# Patient Record
Sex: Female | Born: 1935 | Race: White | Hispanic: No | Marital: Married
Health system: Southern US, Community
[De-identification: ages and names within clinical notes are randomized; demographics above are authoritative.]

---

## 1998-08-26 ENCOUNTER — Encounter: Payer: Self-pay | Admitting: Thoracic Surgery

## 1998-08-26 ENCOUNTER — Ambulatory Visit (HOSPITAL_COMMUNITY): Admission: RE | Admit: 1998-08-26 | Discharge: 1998-08-30 | Payer: Self-pay | Admitting: Thoracic Surgery

## 1998-09-06 ENCOUNTER — Ambulatory Visit (HOSPITAL_COMMUNITY): Admission: RE | Admit: 1998-09-06 | Discharge: 1998-09-06 | Payer: Self-pay | Admitting: Thoracic Surgery

## 1998-11-22 ENCOUNTER — Ambulatory Visit (HOSPITAL_COMMUNITY): Admission: RE | Admit: 1998-11-22 | Discharge: 1998-11-22 | Payer: Self-pay | Admitting: Thoracic Surgery

## 1999-03-01 ENCOUNTER — Encounter: Payer: Self-pay | Admitting: Thoracic Surgery

## 1999-03-01 ENCOUNTER — Ambulatory Visit (HOSPITAL_COMMUNITY): Admission: RE | Admit: 1999-03-01 | Discharge: 1999-03-01 | Payer: Self-pay | Admitting: Thoracic Surgery

## 1999-05-22 ENCOUNTER — Inpatient Hospital Stay (HOSPITAL_COMMUNITY): Admission: AD | Admit: 1999-05-22 | Discharge: 1999-05-26 | Payer: Self-pay | Admitting: *Deleted

## 2002-09-10 ENCOUNTER — Ambulatory Visit (HOSPITAL_COMMUNITY): Admission: RE | Admit: 2002-09-10 | Discharge: 2002-09-10 | Payer: Self-pay | Admitting: Vascular Surgery

## 2002-09-10 ENCOUNTER — Encounter: Payer: Self-pay | Admitting: Vascular Surgery

## 2004-02-13 ENCOUNTER — Observation Stay (HOSPITAL_COMMUNITY): Admission: EM | Admit: 2004-02-13 | Discharge: 2004-02-13 | Payer: Self-pay | Admitting: Emergency Medicine

## 2004-02-22 ENCOUNTER — Ambulatory Visit (HOSPITAL_COMMUNITY): Admission: RE | Admit: 2004-02-22 | Discharge: 2004-02-22 | Payer: Self-pay | Admitting: Vascular Surgery

## 2004-07-18 ENCOUNTER — Ambulatory Visit (HOSPITAL_COMMUNITY): Admission: RE | Admit: 2004-07-18 | Discharge: 2004-07-18 | Payer: Self-pay | Admitting: Vascular Surgery

## 2004-08-02 ENCOUNTER — Inpatient Hospital Stay (HOSPITAL_COMMUNITY): Admission: AD | Admit: 2004-08-02 | Discharge: 2004-08-06 | Payer: Self-pay | Admitting: Nephrology

## 2004-08-29 IMAGING — CR DG CHEST 2V
2 series · 2 of 2 positions shown · non-contrast
Comparison: none

CLINICAL DATA: Renal failure.  Preoperative exam.
 TWO VIEWS OF THE CHEST ? 02/22/04 
 Comparison study from 02/13/04.

[view not recorded (1 of 2)]
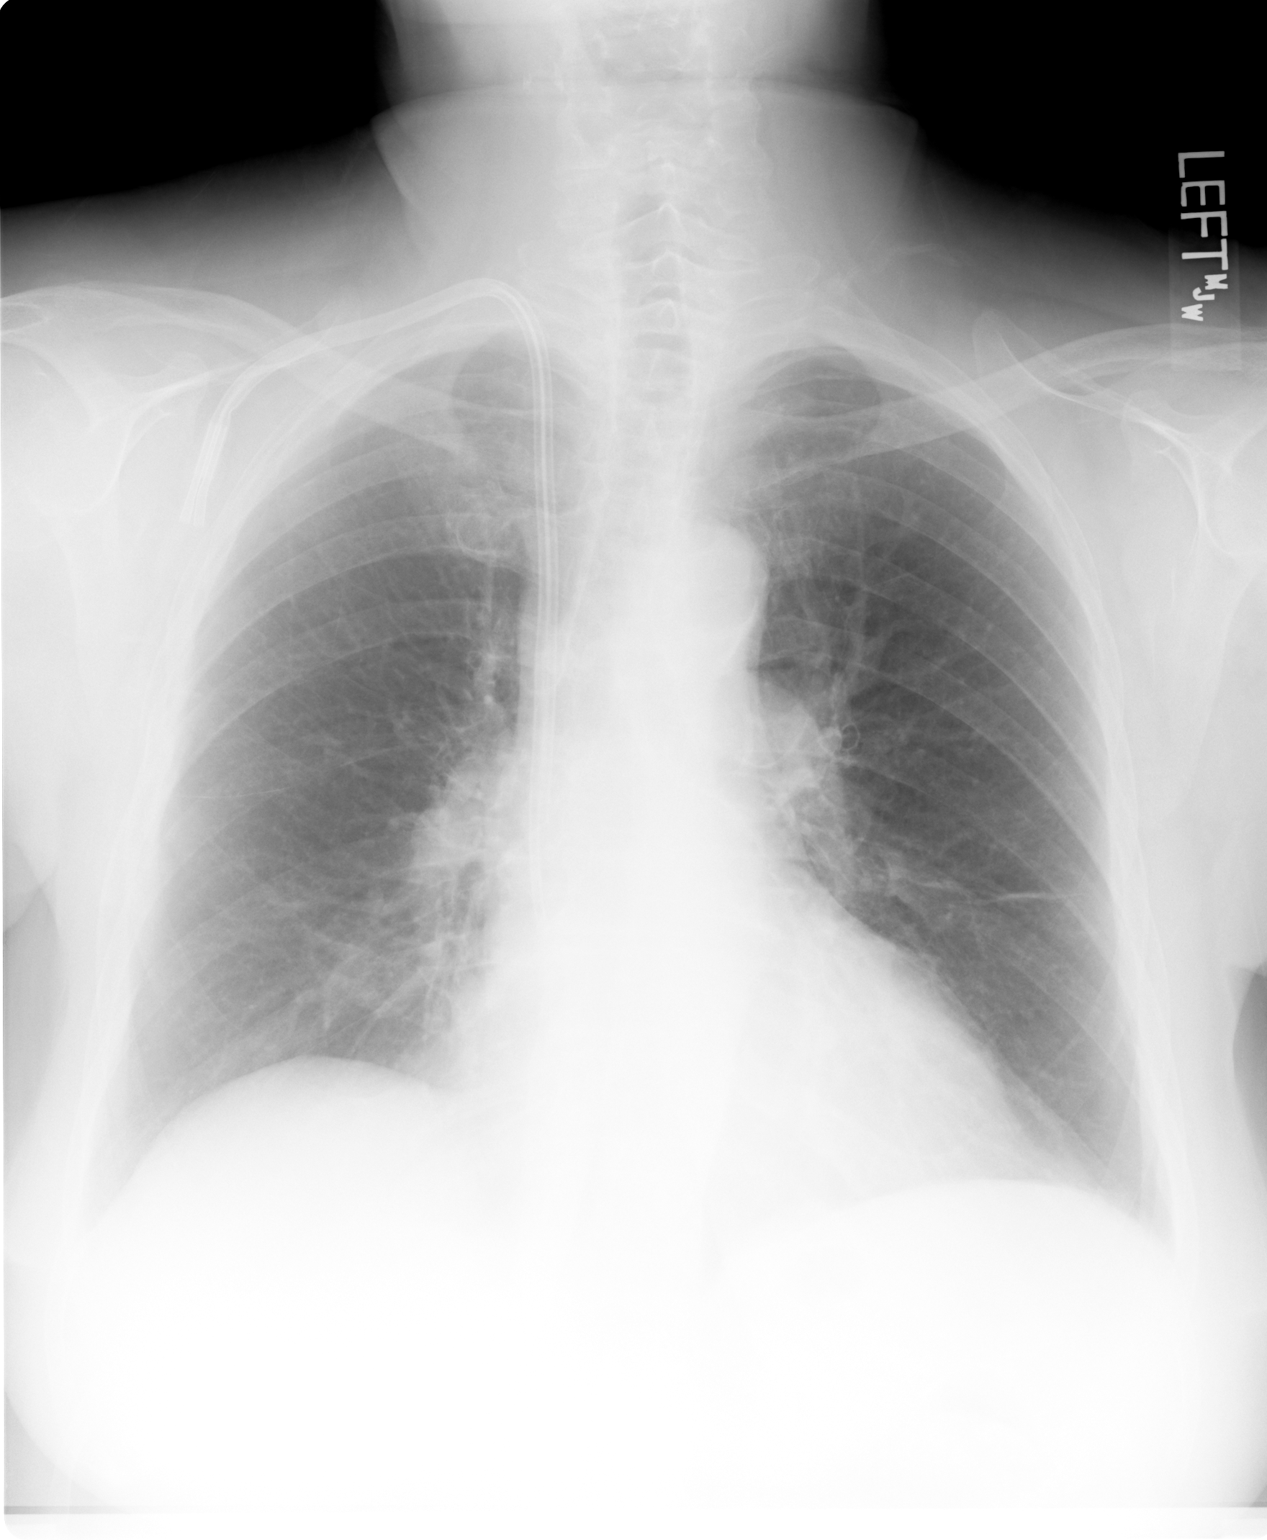

[view not recorded (2 of 2)]
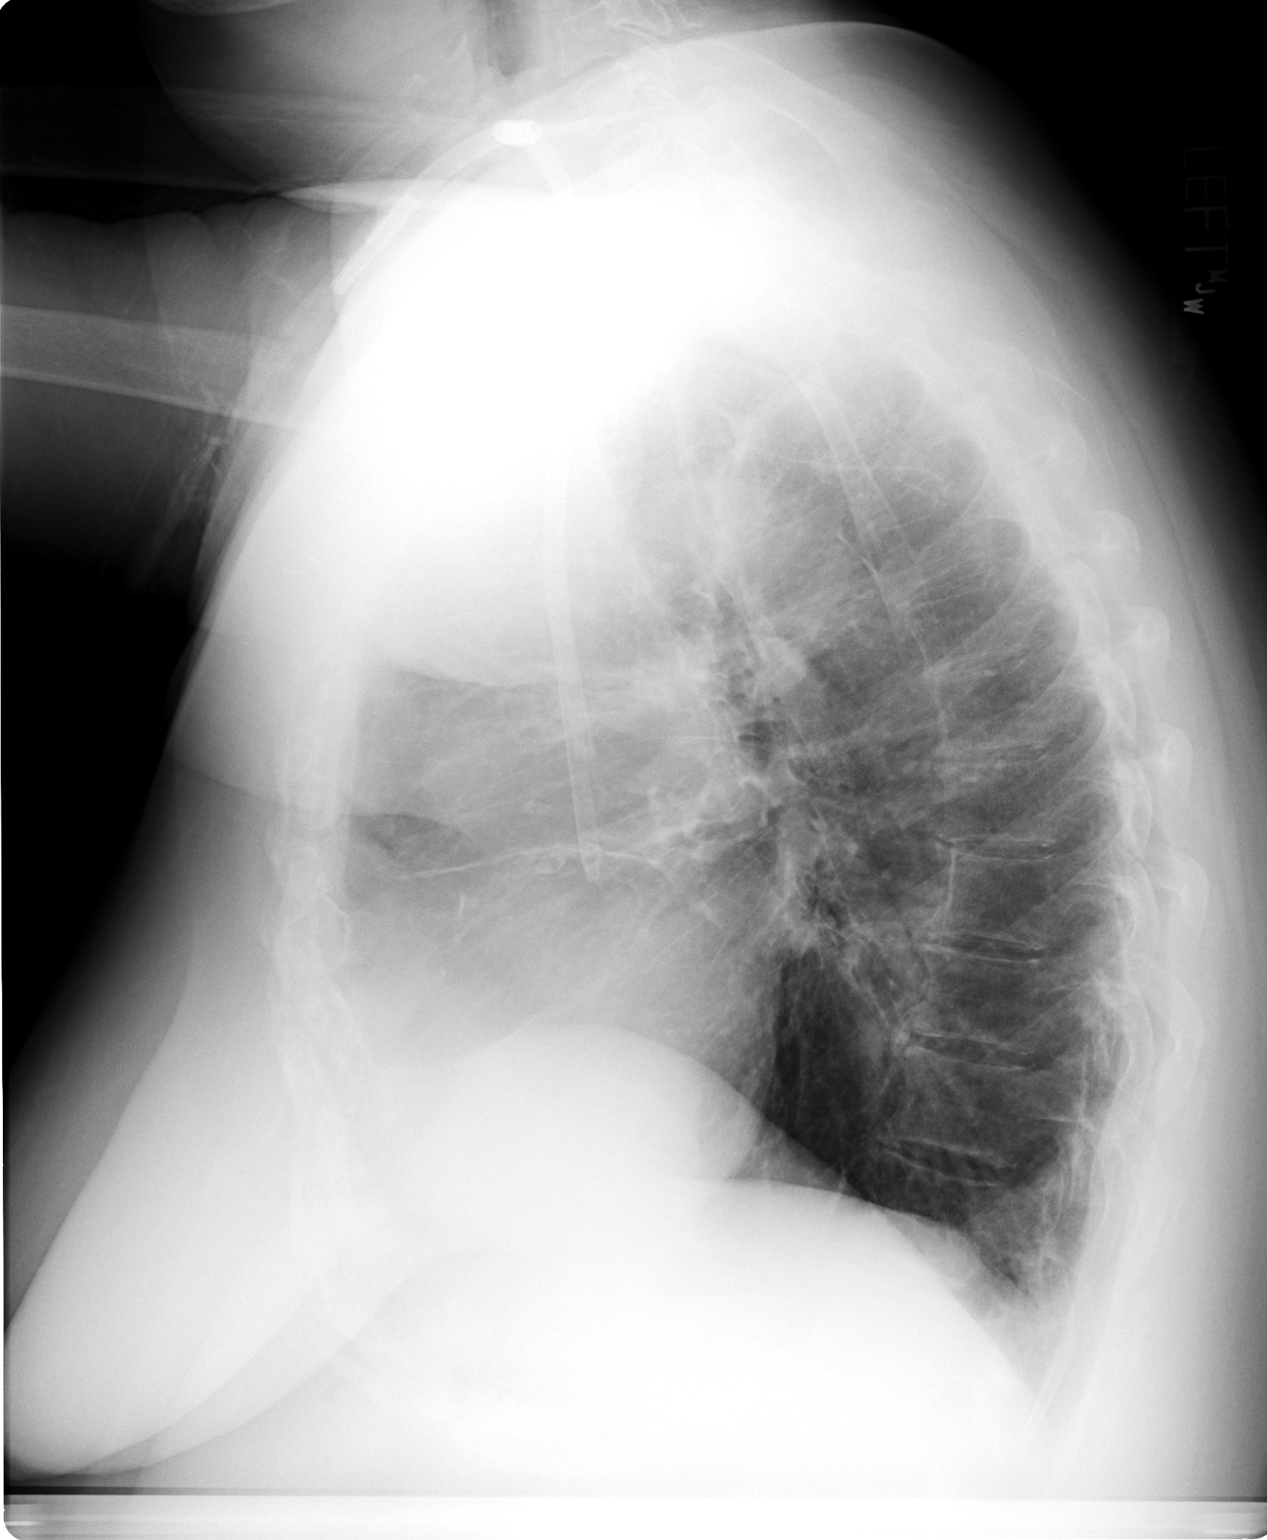

[2 of 2 positions shown; findings below may reference images not displayed]

FINDINGS: Dialysis catheter remains in stable position.  The tips are in the superior vena cava and right atrium.  The lungs remain clear without focal infiltrate or edema.  No pleural effusions.  The cardiomediastinal contours are stable.
IMPRESSION: No radiographic evidence of an acute cardiopulmonary process.

## 2005-02-07 IMAGING — CR DG CHEST 1V PORT
1 series · 1 of 1 positions shown · non-contrast
Comparison: 02/22/2004

Chest one view

Clinical: Cardiac arrest

[view not recorded]
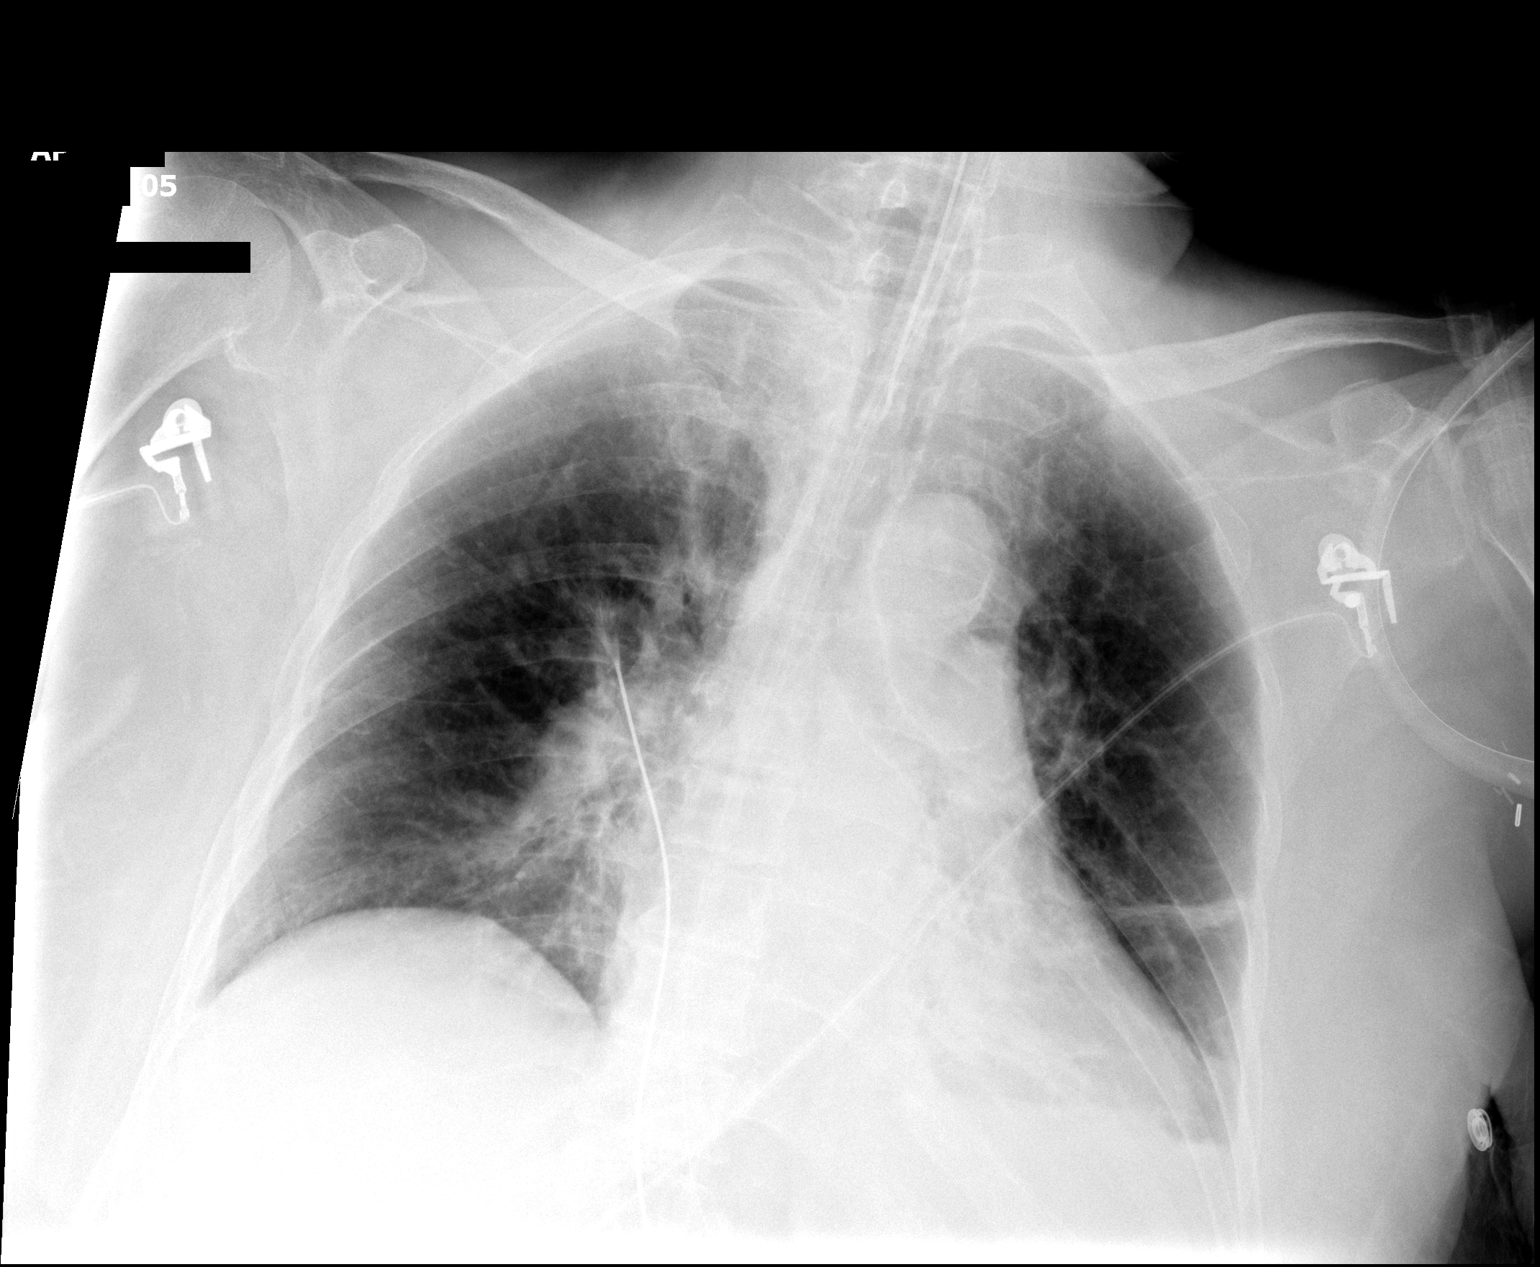

[1 of 1 positions shown; findings below may reference images not displayed]

FINDINGS: The right IJ vein dialysis catheter has been removed. An endotracheal tube is seen. The
tip is 5.7 cm from the carina. An NG tube is present and projects beyond the lower limit of the
study. The heart is mildly enlarged. The pulmonary vascular is within normal limits. Left basilar
atelectasis and a left pleural effusion are present. There is no significant pulmonary edema.
IMPRESSION: 1. Endotracheal and NG tubes as described.
2. Left pleural effusion.
3. Left basilar atelectasis.

## 2020-05-13 ENCOUNTER — Encounter: Payer: Self-pay | Admitting: Internal Medicine

## 2020-05-15 NOTE — Progress Notes (Signed)
A user error has taken place.
